# Patient Record
Sex: Female | Born: 1980 | Race: White | Hispanic: No | Marital: Single
Health system: Southern US, Community
[De-identification: ages and names within clinical notes are randomized; demographics above are authoritative.]

---

## 2002-12-15 ENCOUNTER — Emergency Department (HOSPITAL_COMMUNITY): Admission: EM | Admit: 2002-12-15 | Discharge: 2002-12-15 | Payer: Self-pay

## 2003-09-27 ENCOUNTER — Emergency Department (HOSPITAL_COMMUNITY): Admission: EM | Admit: 2003-09-27 | Discharge: 2003-09-27 | Payer: Self-pay | Admitting: Emergency Medicine

## 2003-12-03 ENCOUNTER — Inpatient Hospital Stay (HOSPITAL_COMMUNITY): Admission: AD | Admit: 2003-12-03 | Discharge: 2003-12-04 | Payer: Self-pay | Admitting: Family Medicine

## 2004-01-14 ENCOUNTER — Inpatient Hospital Stay (HOSPITAL_COMMUNITY): Admission: AD | Admit: 2004-01-14 | Discharge: 2004-01-14 | Payer: Self-pay | Admitting: *Deleted

## 2004-02-22 ENCOUNTER — Inpatient Hospital Stay (HOSPITAL_COMMUNITY): Admission: AD | Admit: 2004-02-22 | Discharge: 2004-02-22 | Payer: Self-pay | Admitting: Obstetrics & Gynecology

## 2004-03-27 ENCOUNTER — Inpatient Hospital Stay (HOSPITAL_COMMUNITY): Admission: AD | Admit: 2004-03-27 | Discharge: 2004-04-08 | Payer: Self-pay | Admitting: Obstetrics and Gynecology

## 2005-01-06 ENCOUNTER — Inpatient Hospital Stay (HOSPITAL_COMMUNITY): Admission: AD | Admit: 2005-01-06 | Discharge: 2005-01-06 | Payer: Self-pay | Admitting: *Deleted

## 2005-04-27 ENCOUNTER — Emergency Department (HOSPITAL_COMMUNITY): Admission: EM | Admit: 2005-04-27 | Discharge: 2005-04-27 | Payer: Self-pay | Admitting: *Deleted

## 2005-07-02 IMAGING — US US OB LIMITED
1 series · 14 of 28 positions shown · non-contrast
Comparison: none

CLINICAL DATA: 25 week 0 day assigned gestational age.  Vaginal bleeding.  Evaluate placenta, cervix, and biophysical profile.

[Series 1: unknown · 0.32mm/px · 14 of 32 slices shown]
[im 2/32]
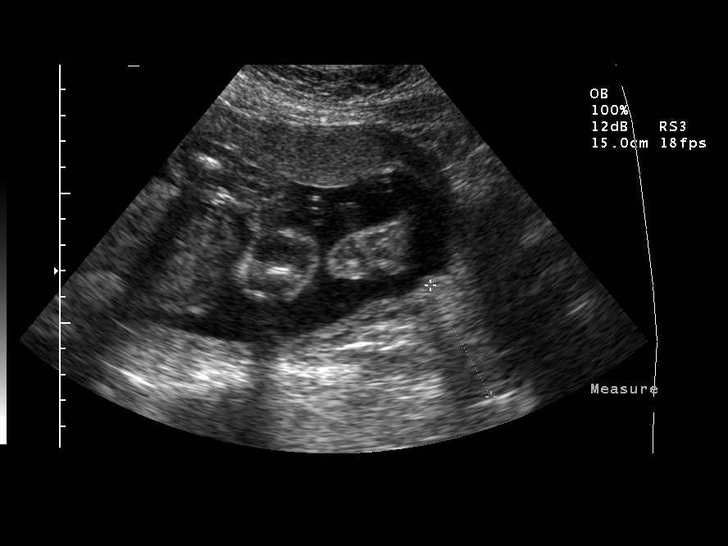
[im 4/32]
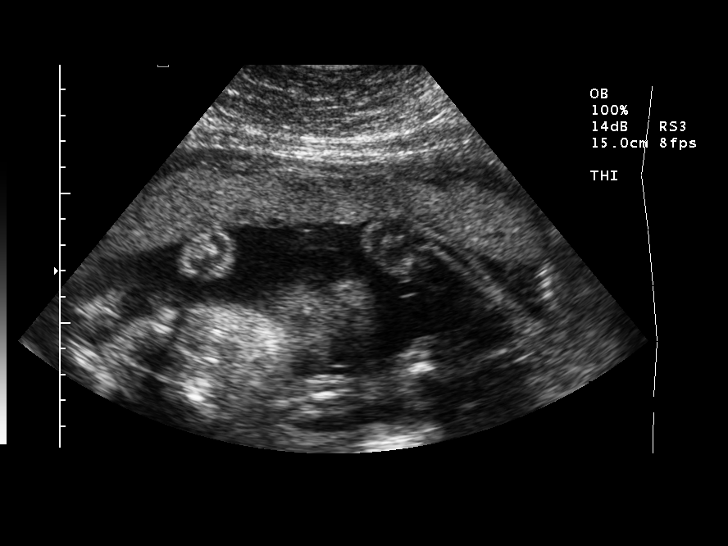
[im 6/32]
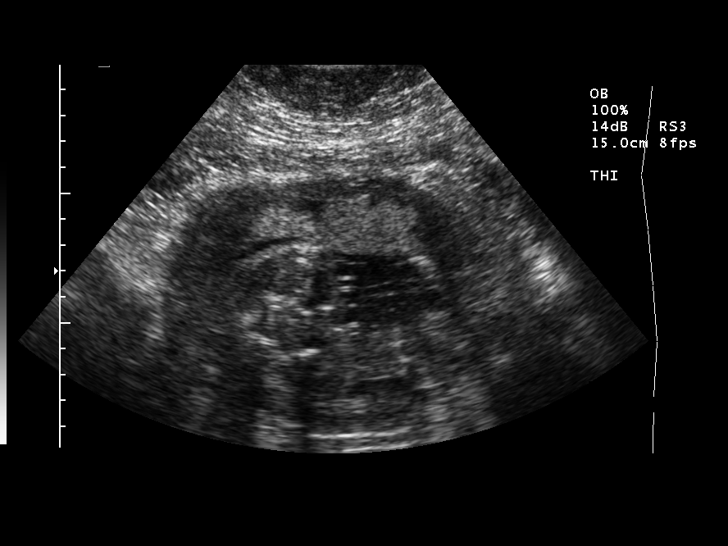
[im 9/32]
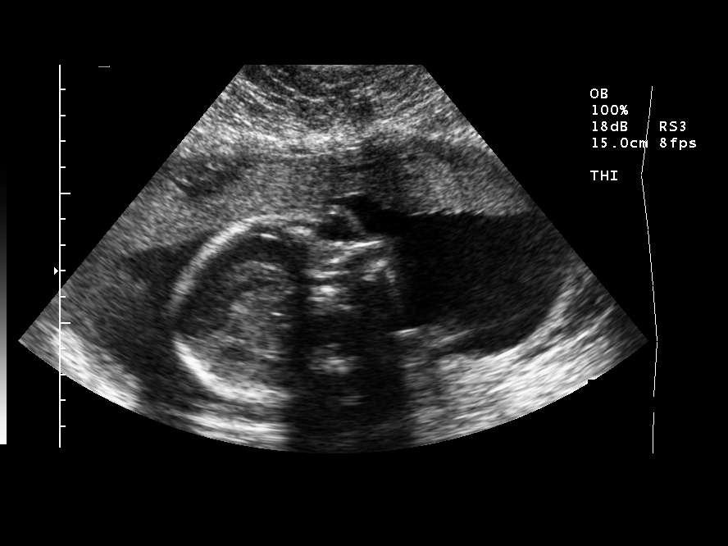
[im 11/32]
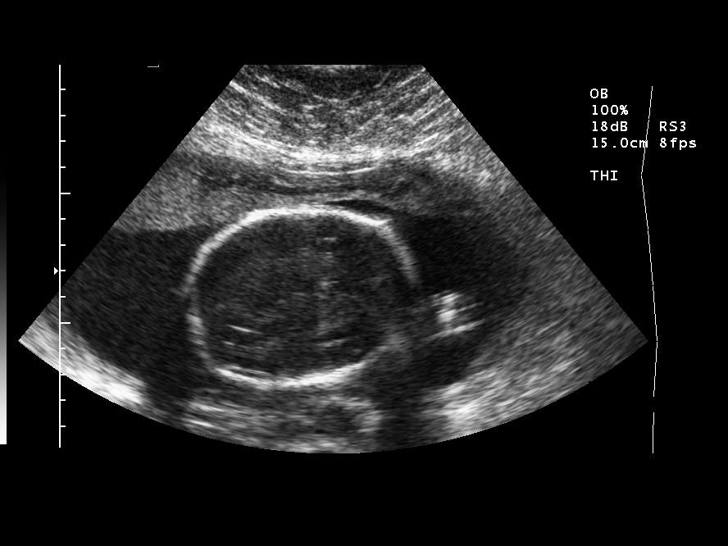
[im 13/32]
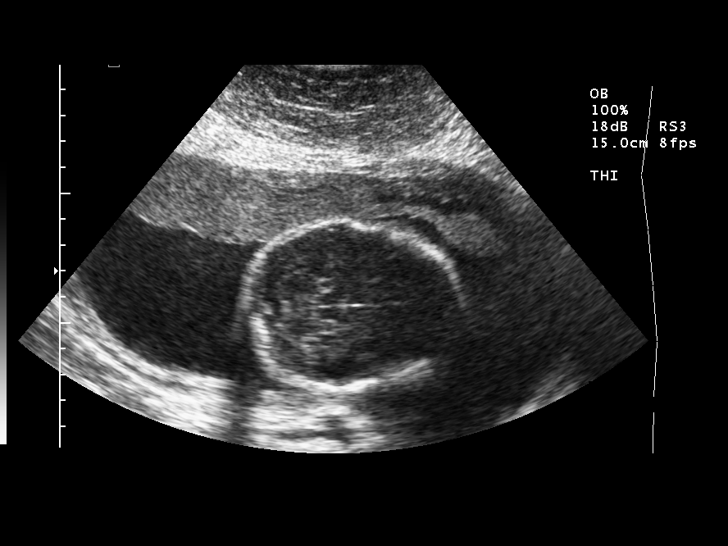
[im 15/32]
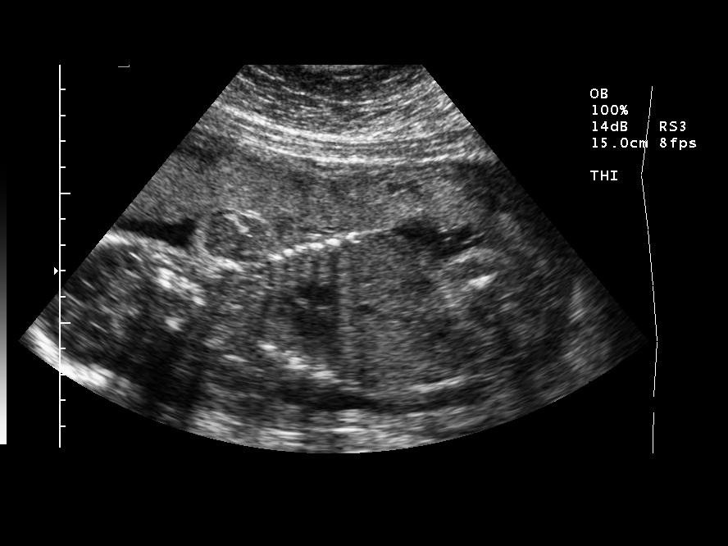
[im 18/32]
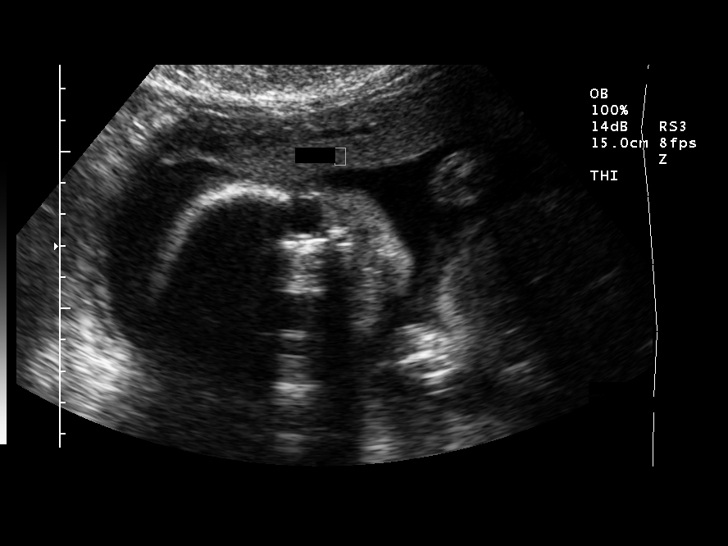
[im 20/32]
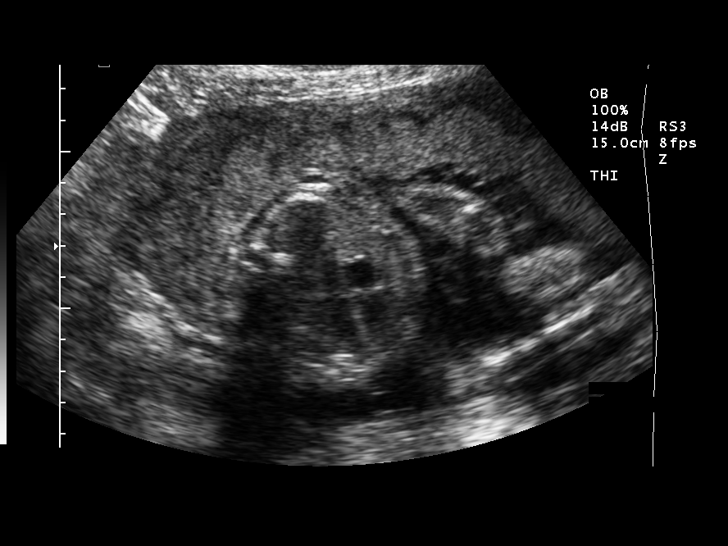
[im 22/32]
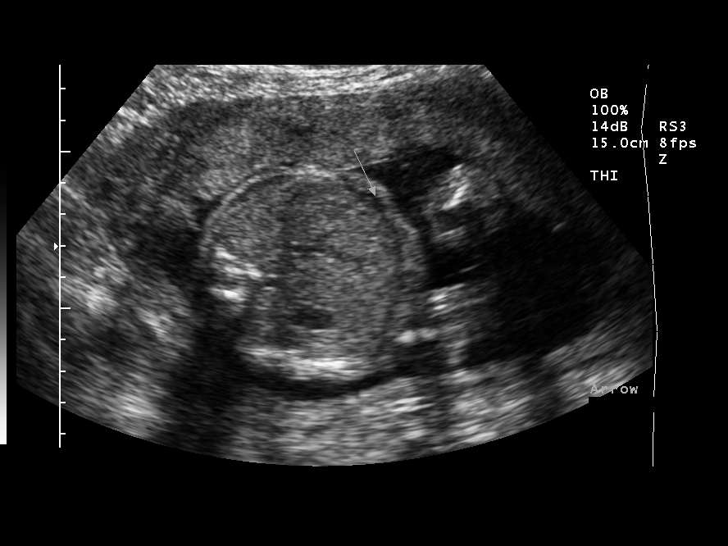
[im 25/32]
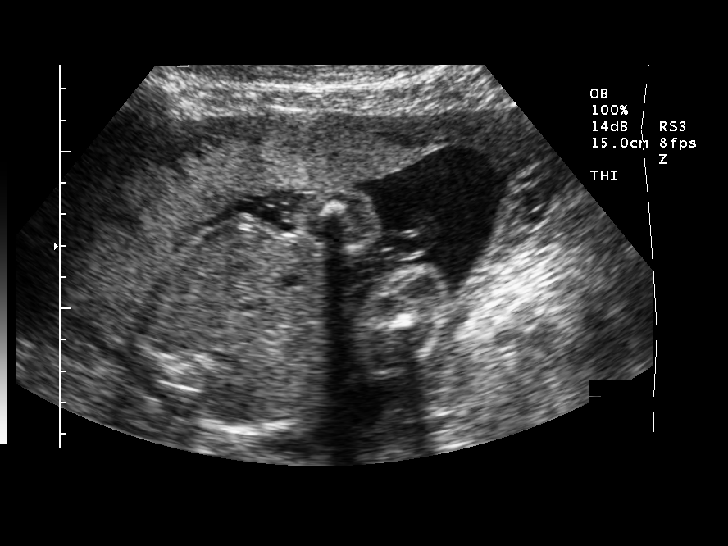
[im 27/32]
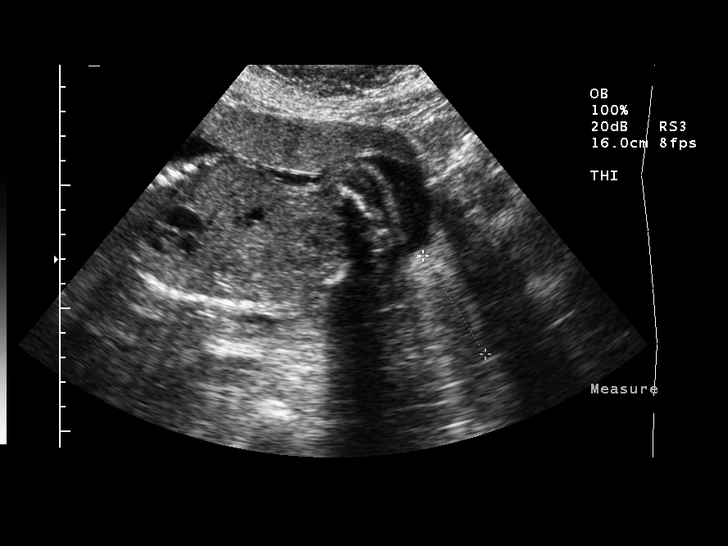
[im 29/32]
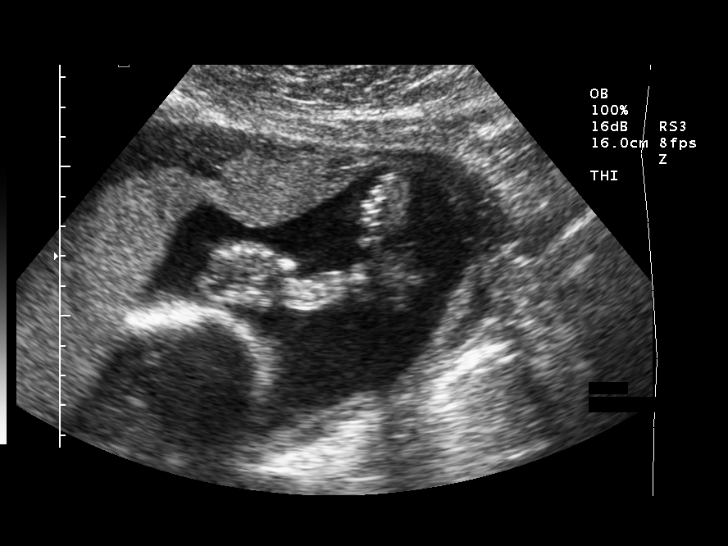
[im 32/32]
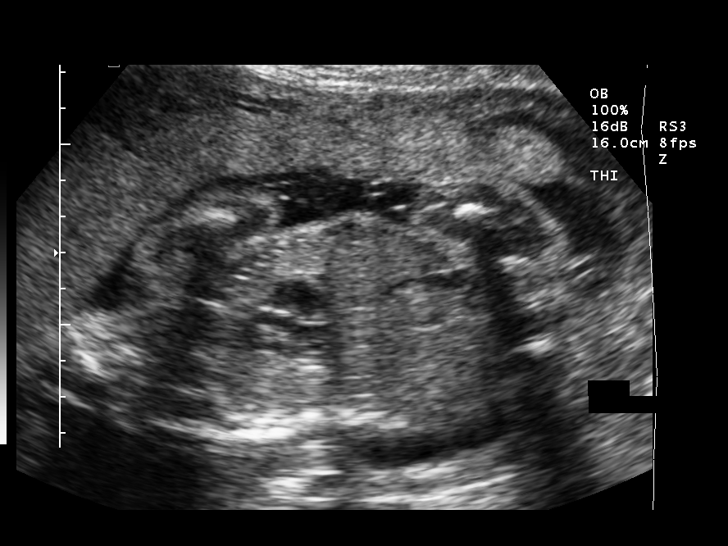

[14 of 28 positions shown; findings below may reference images not displayed]

LIMITED OBSTETRICAL ULTRASOUND
 Number of Fetuses:  1
 Heart Rate:  153 BPM
 Movement:  Yes
 Breathing:  Yes
 Presentation:  Variable
 Placental Location:  Anterior
 Grade:  I
 Previa:  No
 Amniotic Fluid (Subjective):  Normal
 Amniotic Fluid (Objective):  5.9 cm vertical pocket

 Fetal measurements and complete anatomic evaluation were not requested.  The following fetal anatomy was visualized during this exam:  lateral ventricles, thalami, posterior fossa, four-chamber heart, stomach, three-vessel cord, cord insertion, kidneys, bladder, LVOT, RVOT, upper lip, and diaphragm.

 MATERNAL FINDINGS
 Cervix:  4.7 transabdominal
IMPRESSION: Single living intrauterine fetus.  Normal amniotic fluid volume.  
 Anterior placenta.  No evidence of placental abruption or previa.  
 Normal cervical length.
 BIOPHYSICAL PROFILE

 Movement:  2  Time:  20 minutes
 Breathing:  2
 Tone:  2
 Amniotic Fluid:  2

 Total Score:  8
IMPRESSION: Biophysical profile score [DATE].

## 2010-10-12 ENCOUNTER — Encounter: Payer: Self-pay | Admitting: Orthopedic Surgery

## 2015-03-12 ENCOUNTER — Encounter: Payer: Self-pay | Admitting: *Deleted

## 2015-03-12 NOTE — Telephone Encounter (Signed)
Chart pulled in Error
